# Patient Record
Sex: Male | Born: 2000 | Hispanic: No | Marital: Single | Smoking: Never smoker
Health system: Southern US, Community
[De-identification: ages and names within clinical notes are randomized; demographics above are authoritative.]

---
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
## 2019-05-02 ENCOUNTER — Encounter (HOSPITAL_COMMUNITY): Payer: Self-pay

## 2019-05-02 ENCOUNTER — Other Ambulatory Visit: Payer: Self-pay

## 2019-05-02 ENCOUNTER — Ambulatory Visit (HOSPITAL_COMMUNITY)
Admission: EM | Admit: 2019-05-02 | Discharge: 2019-05-02 | Disposition: A | Discharge status: Home / Self Care | Payer: Managed Care, Other (non HMO) | Attending: Family Medicine | Admitting: Family Medicine

## 2019-05-02 DIAGNOSIS — R05 Cough: Secondary | ICD-10-CM | POA: Diagnosis not present

## 2019-05-02 DIAGNOSIS — B9789 Other viral agents as the cause of diseases classified elsewhere: Secondary | ICD-10-CM

## 2019-05-02 DIAGNOSIS — Z20828 Contact with and (suspected) exposure to other viral communicable diseases: Secondary | ICD-10-CM | POA: Insufficient documentation

## 2019-05-02 DIAGNOSIS — J069 Acute upper respiratory infection, unspecified: Secondary | ICD-10-CM | POA: Insufficient documentation

## 2019-05-02 MED ORDER — FLUTICASONE PROPIONATE 50 MCG/ACT NA SUSP: 1.0000 | Freq: Every day | NASAL | 0 refills | Status: DC

## 2019-05-02 MED ORDER — CETIRIZINE HCL 10 MG PO CAPS: 10.0000 mg | ORAL_CAPSULE | Freq: Every day | ORAL | 0 refills | Status: DC

## 2019-05-02 NOTE — ED Triage Notes (Signed)
Pt present nasal congestion and sore throat. Symptoms started on Friday. Pt states his throat is not hurting any more after he took some OTC medication but he is still having the nasal congestion.

## 2019-05-02 NOTE — Discharge Instructions (Addendum)
COVID pending, monitor MyChart for results, quarantine until results return - if positive must quarantine 10 days from symptoms onset, symptoms improving and fever free for 24 hours Daily cetirizine and flonase for congestion  Follow up if not improving or worsening

## 2019-05-04 LAB — NOVEL CORONAVIRUS, NAA (HOSP ORDER, SEND-OUT TO REF LAB; TAT 18-24 HRS): SARS-CoV-2, NAA: NOT DETECTED

## 2019-05-04 NOTE — ED Provider Notes (Signed)
[facility]    CSN: [id number] Arrival date & time: 05/02/19  1424      History   Chief Complaint Chief Complaint  Patient presents with  . Nasal Congestion    HPI Louis Day is a 18 y.o. male no significant past medical history presenting today for evaluation of URI symptoms.  Patient states that he has had congestion and sore throat which began on Friday, persisted for the past 3 days.  His throat throat has improved, but continues to have nasal congestion.  He denies any coughing.  Denies any fevers chills or body aches.  Denies any known exposure to Covid.  Denies chest pain or shortness of breath.   HPI  History reviewed. No pertinent past medical history.  There are no problems to display for this patient.   History reviewed. No pertinent surgical history.     Home Medications    Prior to Admission medications   Medication Sig Start Date End Date Taking? Authorizing Provider  Cetirizine HCl 10 MG CAPS Take 1 capsule (10 mg total) by mouth daily. 05/02/19   Louetta Hollingshead C, PA-C  fluticasone (FLONASE) 50 MCG/ACT nasal spray Place 1-2 sprays into both nostrils daily. 05/02/19   Yalexa Blust C, PA-C  ibuprofen (ADVIL,MOTRIN) 800 MG tablet Take 1 tablet (800 mg total) by mouth 3 (three) times daily. 01/15/17   Muthersbaugh, Dahlia Client, PA-C  oseltamivir (TAMIFLU) 75 MG capsule Take 1 capsule (75 mg total) by mouth every 12 (twelve) hours. 07/18/15   Marlon Pel, PA-C    Family History History reviewed. No pertinent family history.  Social History Social History   Tobacco Use  . Smoking status: Never Smoker  Substance Use Topics  . Alcohol use: No  . Drug use: No     Allergies   Patient has no known allergies.   Review of Systems Review of Systems  Constitutional: Negative for activity change, appetite change, chills, fatigue and fever.  HENT: Positive for congestion and sore throat. Negative for ear pain, rhinorrhea, sinus pressure  and trouble swallowing.   Eyes: Negative for discharge and redness.  Respiratory: Negative for cough, chest tightness and shortness of breath.   Cardiovascular: Negative for chest pain.  Gastrointestinal: Negative for abdominal pain, diarrhea, nausea and vomiting.  Musculoskeletal: Negative for myalgias.  Skin: Negative for rash.  Neurological: Negative for dizziness, light-headedness and headaches.     Physical Exam Triage Vital Signs ED Triage Vitals  Enc Vitals Group     BP 05/02/19 1607 113/70     Pulse Rate 05/02/19 1607 82     Resp 05/02/19 1607 18     Temp 05/02/19 1607 98.5 F (36.9 C)     Temp Source 05/02/19 1607 Oral     SpO2 05/02/19 1607 100 %     Weight --      Height --      Head Circumference --      Peak Flow --      Pain Score 05/02/19 1609 0     Pain Loc --      Pain Edu? --      Excl. in GC? --    No data found.  Updated Vital Signs BP 113/70 (BP Location: Left Arm)   Pulse 82   Temp 98.5 F (36.9 C) (Oral)   Resp 18   SpO2 100%   Visual Acuity Right Eye Distance:   Left Eye Distance:   Bilateral Distance:    Right Eye Near:  Left Eye Near:    Bilateral Near:     Physical Exam Vitals and nursing note reviewed.  Constitutional:      Appearance: He is well-developed.  HENT:     Head: Normocephalic and atraumatic.     Ears:     Comments: Bilateral ears without tenderness to palpation of external auricle, tragus and mastoid, EAC's without erythema or swelling, TM's with good bony landmarks and cone of light. Non erythematous.     Mouth/Throat:     Comments: Oral mucosa pink and moist, no tonsillar enlargement or exudate. Posterior pharynx patent and nonerythematous, no uvula deviation or swelling. Normal phonation. Eyes:     Conjunctiva/sclera: Conjunctivae normal.  Cardiovascular:     Rate and Rhythm: Normal rate and regular rhythm.     Heart sounds: No murmur.  Pulmonary:     Effort: Pulmonary effort is normal. No respiratory  distress.     Breath sounds: Normal breath sounds.     Comments: Breathing comfortably at rest, CTABL, no wheezing, rales or other adventitious sounds auscultated Abdominal:     Palpations: Abdomen is soft.     Tenderness: There is no abdominal tenderness.  Musculoskeletal:     Cervical back: Neck supple.  Skin:    General: Skin is warm and dry.  Neurological:     Mental Status: He is alert.      UC Treatments / Results  Labs (all labs ordered are listed, but only abnormal results are displayed) Labs Reviewed  NOVEL CORONAVIRUS, NAA (HOSP ORDER, SEND-OUT TO REF LAB; TAT 18-24 HRS)    EKG   Radiology No results found.  Procedures Procedures (including critical care time)  Medications Ordered in UC Medications - No data to display  Initial Impression / Assessment and Plan / UC Course  I have reviewed the triage vital signs and the nursing notes.  Pertinent labs & imaging results that were available during my care of the patient were reviewed by me and considered in my medical decision making (see chart for details).    URI symptoms x3 to 4 days, Covid swab pending, will have quarantine until results return.  Will treat symptomatically and supportively at this time, vital signs stable, no acute distress, exam unremarkable.  Cetirizine and Flonase for congestion/sneezing.  Continue to monitor,Discussed strict return precautions. Patient verbalized understanding and is agreeable with plan.  Final Clinical Impressions(s) / UC Diagnoses   Final diagnoses:  Viral URI with cough     Discharge Instructions     COVID pending, monitor MyChart for results, quarantine until results return - if positive must quarantine 10 days from symptoms onset, symptoms improving and fever free for 24 hours Daily cetirizine and flonase for congestion  Follow up if not improving or worsening   ED Prescriptions    Medication Sig Dispense Auth. Provider   fluticasone (FLONASE) 50 MCG/ACT  nasal spray Place 1-2 sprays into both nostrils daily. 1 g Zackeriah Kissler C, PA-C   Cetirizine HCl 10 MG CAPS Take 1 capsule (10 mg total) by mouth daily. 15 capsule Marae Cottrell, [place] C, PA-C     PDMP not reviewed this encounter.   Janith Lima, [place] 05/04/19 [phone]

## 2019-06-06 ENCOUNTER — Ambulatory Visit (HOSPITAL_COMMUNITY)
Admission: EM | Admit: 2019-06-06 | Discharge: 2019-06-06 | Disposition: A | Discharge status: Home / Self Care | Payer: Managed Care, Other (non HMO) | Attending: Internal Medicine | Admitting: Internal Medicine

## 2019-06-06 ENCOUNTER — Encounter (HOSPITAL_COMMUNITY): Payer: Self-pay

## 2019-06-06 DIAGNOSIS — B349 Viral infection, unspecified: Secondary | ICD-10-CM | POA: Diagnosis not present

## 2019-06-06 DIAGNOSIS — Z7952 Long term (current) use of systemic steroids: Secondary | ICD-10-CM | POA: Insufficient documentation

## 2019-06-06 DIAGNOSIS — R21 Rash and other nonspecific skin eruption: Secondary | ICD-10-CM | POA: Insufficient documentation

## 2019-06-06 DIAGNOSIS — Z79899 Other long term (current) drug therapy: Secondary | ICD-10-CM | POA: Insufficient documentation

## 2019-06-06 DIAGNOSIS — Z20822 Contact with and (suspected) exposure to covid-19: Secondary | ICD-10-CM | POA: Insufficient documentation

## 2019-06-06 LAB — POC SARS CORONAVIRUS 2 AG -  ED: SARS Coronavirus 2 Ag: NEGATIVE

## 2019-06-06 LAB — POC SARS CORONAVIRUS 2 AG: SARS Coronavirus 2 Ag: NEGATIVE

## 2019-06-06 MED ORDER — ONDANSETRON 4 MG PO TBDP: 4.0000 mg | ORAL_TABLET | Freq: Three times a day (TID) | ORAL | 0 refills | Status: DC | PRN

## 2019-06-06 MED ORDER — PREDNISONE 10 MG (21) PO TBPK: ORAL_TABLET | ORAL | 0 refills | Status: DC

## 2019-06-06 MED ORDER — DIPHENHYDRAMINE HCL 25 MG PO TABS: 25.0000 mg | ORAL_TABLET | Freq: Four times a day (QID) | ORAL | 0 refills | Status: DC | PRN

## 2019-06-06 MED ORDER — PERMETHRIN 5 % EX CREA: TOPICAL_CREAM | CUTANEOUS | 0 refills | Status: DC

## 2019-06-06 NOTE — ED Triage Notes (Signed)
Pt presents to UC with headaches, body aches, nausea and vomiting x 3 days. Pt reports he woke up this morning with red spots in his back and hands.

## 2019-06-06 NOTE — Discharge Instructions (Addendum)
Take the prednisone daily with food for the next 6 days. Covid swab pending Rapid Covid test negative here. Zofran as needed for nausea, vomiting.  Make sure you are staying hydrated and drinking fluids to include Gatorade and water Advance diet as tolerated Tylenol as needed for headache.  Follow up as needed for continued or worsening symptoms

## 2019-06-07 NOTE — ED Provider Notes (Signed)
[facility]    CSN: [id number] Arrival date & time: 06/06/19  1153      History   Chief Complaint Chief Complaint  Patient presents with  . Headache  . Emesis  . Generalized Body Aches    HPI Louis Day is a 19 y.o. male.   Pt is a 19 year old male that presents with headache, body aches, nausea, and vomiting x 3. This has been present x 3 days. Symptoms have been constant, waxing and waning. He has also developed a rash. The rash is widespread. Very itchy. He has been using hydrocortisone cream with some relief. Unsure if he has had a fever but has had hot flashes. No known sick contacts. No recent travels.  Denies any  joint pain. Denies any recent changes in lotions, detergents, foods or other possible irritants. No recent travel. Nobody else at home has the rash. Patient has been outside but denies any contact with plants or insects. No new foods or medications.   ROS per HPI        History reviewed. No pertinent past medical history.  There are no problems to display for this patient.   History reviewed. No pertinent surgical history.     Home Medications    Prior to Admission medications   Medication Sig Start Date End Date Taking? Authorizing Provider  diphenhydrAMINE (BENADRYL) 25 MG tablet Take 1 tablet (25 mg total) by mouth every 6 (six) hours as needed. 06/06/19   Loura Halt A, NP  ondansetron (ZOFRAN ODT) 4 MG disintegrating tablet Take 1 tablet (4 mg total) by mouth every 8 (eight) hours as needed for nausea or vomiting. 06/06/19   Loura Halt A, NP  predniSONE (STERAPRED UNI-PAK 21 TAB) 10 MG (21) TBPK tablet 6 tabs for 1 day, then 5 tabs for 1 das, then 4 tabs for 1 day, then 3 tabs for 1 day, 2 tabs for 1 day, then 1 tab for 1 day 06/06/19   Loura Halt A, NP  Cetirizine HCl 10 MG CAPS Take 1 capsule (10 mg total) by mouth daily. 05/02/19 06/06/19  Wieters, Hallie C, PA-C  fluticasone (FLONASE) 50 MCG/ACT nasal spray Place 1-2 sprays  into both nostrils daily. 05/02/19 06/06/19  Wieters, Elesa Hacker, PA-C    Family History Family History  Problem Relation Age of Onset  . Healthy Mother   . Healthy Father     Social History Social History   Tobacco Use  . Smoking status: Never Smoker  . Smokeless tobacco: Never Used  Substance Use Topics  . Alcohol use: No  . Drug use: No     Allergies   Patient has no known allergies.   Review of Systems Review of Systems   Physical Exam Triage Vital Signs ED Triage Vitals  Enc Vitals Group     BP 06/06/19 1325 126/80     Pulse Rate 06/06/19 1325 (!) 119     Resp 06/06/19 1325 18     Temp 06/06/19 1325 98.5 F (36.9 C)     Temp Source 06/06/19 1325 Oral     SpO2 06/06/19 1325 100 %     Weight --      Height --      Head Circumference --      Peak Flow --      Pain Score 06/06/19 1323 8     Pain Loc --      Pain Edu? --      Excl. in  GC? --    No data found.  Updated Vital Signs BP 126/80 (BP Location: Left Arm)   Pulse (!) 104   Temp 98.5 F (36.9 C) (Oral)   Resp 18   SpO2 100%   Visual Acuity Right Eye Distance:   Left Eye Distance:   Bilateral Distance:    Right Eye Near:   Left Eye Near:    Bilateral Near:     Physical Exam Vitals and nursing note reviewed.  Constitutional:      General: He is not in acute distress.    Appearance: Normal appearance. He is well-developed. He is not ill-appearing, toxic-appearing or diaphoretic.  HENT:     Head: Normocephalic and atraumatic.     Nose: Nose normal.     Mouth/Throat:     Pharynx: Oropharynx is clear.     Tonsils: 1+ on the right. 1+ on the left.  Cardiovascular:     Rate and Rhythm: Normal rate and regular rhythm.  Pulmonary:     Effort: Pulmonary effort is normal.     Breath sounds: Normal breath sounds.  Musculoskeletal:     Cervical back: Normal range of motion. No rigidity.  Skin:    Findings: Rash present.     Comments: Widespread urticaria   Neurological:     Mental  Status: He is alert.  Psychiatric:        Mood and Affect: Mood normal.      UC Treatments / Results  Labs (all labs ordered are listed, but only abnormal results are displayed) Labs Reviewed  NOVEL CORONAVIRUS, NAA (HOSP ORDER, SEND-OUT TO REF LAB; TAT 18-24 HRS)  POC SARS CORONAVIRUS 2 AG -  ED  POC SARS CORONAVIRUS 2 AG    EKG   Radiology No results found.  Procedures Procedures (including critical care time)  Medications Ordered in UC Medications - No data to display  Initial Impression / Assessment and Plan / UC Course  I have reviewed the triage vital signs and the nursing notes.  Pertinent labs & imaging results that were available during my care of the patient were reviewed by me and considered in my medical decision making (see chart for details).     Viral illness- COVID swab sent for testing.  Rapid COVID test negative here.  Quarantine precautions given.  Rash could be viral vs allergic. Treating with prednisone and benadryl.  Zofran for nausea and tylenol for fever and pain.  Final Clinical Impressions(s) / UC Diagnoses   Final diagnoses:  Viral illness  Rash and nonspecific skin eruption     Discharge Instructions     Take the prednisone daily with food for the next 6 days. Covid swab pending Rapid Covid test negative here. Zofran as needed for nausea, vomiting.  Make sure you are staying hydrated and drinking fluids to include Gatorade and water Advance diet as tolerated Tylenol as needed for headache.  Follow up as needed for continued or worsening symptoms     ED Prescriptions    Medication Sig Dispense Auth. Provider   predniSONE (STERAPRED UNI-PAK 21 TAB) 10 MG (21) TBPK tablet 6 tabs for 1 day, then 5 tabs for 1 das, then 4 tabs for 1 day, then 3 tabs for 1 day, 2 tabs for 1 day, then 1 tab for 1 day 21 tablet Deztiny Sarra A, NP   diphenhydrAMINE (BENADRYL) 25 MG tablet Take 1 tablet (25 mg total) by mouth every 6 (six) hours as  needed. 30 tablet [place],  Jonathen Rathman A, NP   permethrin (ELIMITE) 5 % cream  (Status: Discontinued) Apply to affected area once 60 g Cavion Faiola A, NP   ondansetron (ZOFRAN ODT) 4 MG disintegrating tablet Take 1 tablet (4 mg total) by mouth every 8 (eight) hours as needed for nausea or vomiting. 20 tablet Dahlia Byes A, NP     PDMP not reviewed this encounter.   Janace Aris, NP 06/07/19 [phone]

## 2019-06-08 LAB — NOVEL CORONAVIRUS, NAA (HOSP ORDER, SEND-OUT TO REF LAB; TAT 18-24 HRS): SARS-CoV-2, NAA: NOT DETECTED

## 2020-02-04 ENCOUNTER — Other Ambulatory Visit: Payer: Self-pay

## 2020-02-04 ENCOUNTER — Encounter (HOSPITAL_COMMUNITY): Payer: Self-pay | Admitting: Urgent Care

## 2020-02-04 ENCOUNTER — Ambulatory Visit (HOSPITAL_COMMUNITY)
Admission: EM | Admit: 2020-02-04 | Discharge: 2020-02-04 | Disposition: A | Discharge status: Home / Self Care | Payer: Managed Care, Other (non HMO) | Attending: Urgent Care | Admitting: Urgent Care

## 2020-02-04 DIAGNOSIS — J3489 Other specified disorders of nose and nasal sinuses: Secondary | ICD-10-CM

## 2020-02-04 DIAGNOSIS — R059 Cough, unspecified: Secondary | ICD-10-CM

## 2020-02-04 DIAGNOSIS — M549 Dorsalgia, unspecified: Secondary | ICD-10-CM

## 2020-02-04 DIAGNOSIS — B349 Viral infection, unspecified: Secondary | ICD-10-CM

## 2020-02-04 DIAGNOSIS — R079 Chest pain, unspecified: Secondary | ICD-10-CM

## 2020-02-04 MED ORDER — PROMETHAZINE-DM 6.25-15 MG/5ML PO SYRP: 5.0000 mL | ORAL_SOLUTION | Freq: Every evening | ORAL | 0 refills | Status: DC | PRN

## 2020-02-04 MED ORDER — BENZONATATE 100 MG PO CAPS: 100.0000 mg | ORAL_CAPSULE | Freq: Three times a day (TID) | ORAL | 0 refills | Status: DC | PRN

## 2020-02-04 MED ORDER — CETIRIZINE HCL 10 MG PO TABS: 10.0000 mg | ORAL_TABLET | Freq: Every day | ORAL | 0 refills | Status: DC

## 2020-02-04 NOTE — Discharge Instructions (Signed)

## 2020-02-04 NOTE — ED Provider Notes (Signed)
[facility]   MRN: [id number] DOB: Aug 08, 2000  Subjective:   Louis Day is a 19 y.o. male presenting for 4-day history of acute onset malaise.  Has had runny and stuffy nose, sore throat, cough, left-sided chest pain.  Has also had more back pain recently.  He is not Covid vaccinated.  States that he works 6 days a week, does 12-hour shifts.  Perform strenuous work activities.  Denies history of respiratory disorder, lung disorder, asthma, smoking.  No alcohol use.  Denies taking chronic medications.  Patient is otherwise in good health condition.  No Known Allergies  History reviewed. No pertinent past medical history.   History reviewed. No pertinent surgical history.  Family History  Problem Relation Age of Onset  . Healthy Mother   . Healthy Father     Social History   Tobacco Use  . Smoking status: Never Smoker  . Smokeless tobacco: Never Used  Substance Use Topics  . Alcohol use: No  . Drug use: No    ROS   Objective:   Vitals: BP 116/70 (BP Location: Right Arm)   Pulse (!) 110   Temp 99.4 F (37.4 C) (Oral)   Resp 18   SpO2 100%   Physical Exam Constitutional:      General: He is not in acute distress.    Appearance: Normal appearance. He is well-developed. He is not ill-appearing, toxic-appearing or diaphoretic.  HENT:     Head: Normocephalic and atraumatic.     Right Ear: External ear normal.     Left Ear: External ear normal.     Nose: Nose normal.     Mouth/Throat:     Mouth: Mucous membranes are moist.     Pharynx: Oropharynx is clear. No oropharyngeal exudate or posterior oropharyngeal erythema.  Eyes:     General: No scleral icterus.       Right eye: No discharge.        Left eye: No discharge.     Extraocular Movements: Extraocular movements intact.     Conjunctiva/sclera: Conjunctivae normal.     Pupils: Pupils are equal, round, and reactive to light.  Cardiovascular:     Rate and Rhythm: Normal rate and regular  rhythm.     Heart sounds: Normal heart sounds. No murmur heard.  No friction rub. No gallop.   Pulmonary:     Effort: Pulmonary effort is normal. No respiratory distress.     Breath sounds: Normal breath sounds. No stridor. No wheezing, rhonchi or rales.  Chest:     Chest wall: No tenderness.  Skin:    General: Skin is warm and dry.  Neurological:     Mental Status: He is alert and oriented to person, place, and time.     Motor: No weakness.     Coordination: Coordination normal.     Gait: Gait normal.  Psychiatric:        Mood and Affect: Mood normal.        Behavior: Behavior normal.        Thought Content: Thought content normal.        Judgment: Judgment normal.      Assessment and Plan :   PDMP not reviewed this encounter.  1. Viral syndrome   2. Stuffy and runny nose   3. Cough   4. Acute back pain, unspecified back location, unspecified back pain laterality   5. Left-sided chest pain     Will manage for viral illness  such as viral URI, viral syndrome, viral rhinitis, COVID-19. Counseled patient on nature of COVID-19 including modes of transmission, diagnostic testing, management and supportive care.  Offered scripts for symptomatic relief. COVID 19 testing is pending.  Discussed importance of self-care, limiting his work hours to less than 60/week.  Counseled patient on potential for adverse effects with medications prescribed/recommended today, ER and return-to-clinic precautions discussed, patient verbalized understanding.     Wallis Bamberg, PA-C 02/04/20 1428

## 2020-02-04 NOTE — ED Triage Notes (Signed)
Pt presents with left sided middle back pain x 4 days. Pt states the back pain can be related to his work, as he works 6 days a week 12 hrs shift. Pt states he feel he is skinny and not eating well. Aleve gives no relief.

## 2020-09-28 IMAGING — DX DG CHEST 1V
1 series · 1 of 1 positions shown · non-contrast
Comparison: Two-view chest x-ray 12/11/2016

CLINICAL DATA: Physical exam.

EXAM:
CHEST  1 VIEW

[chest pa]
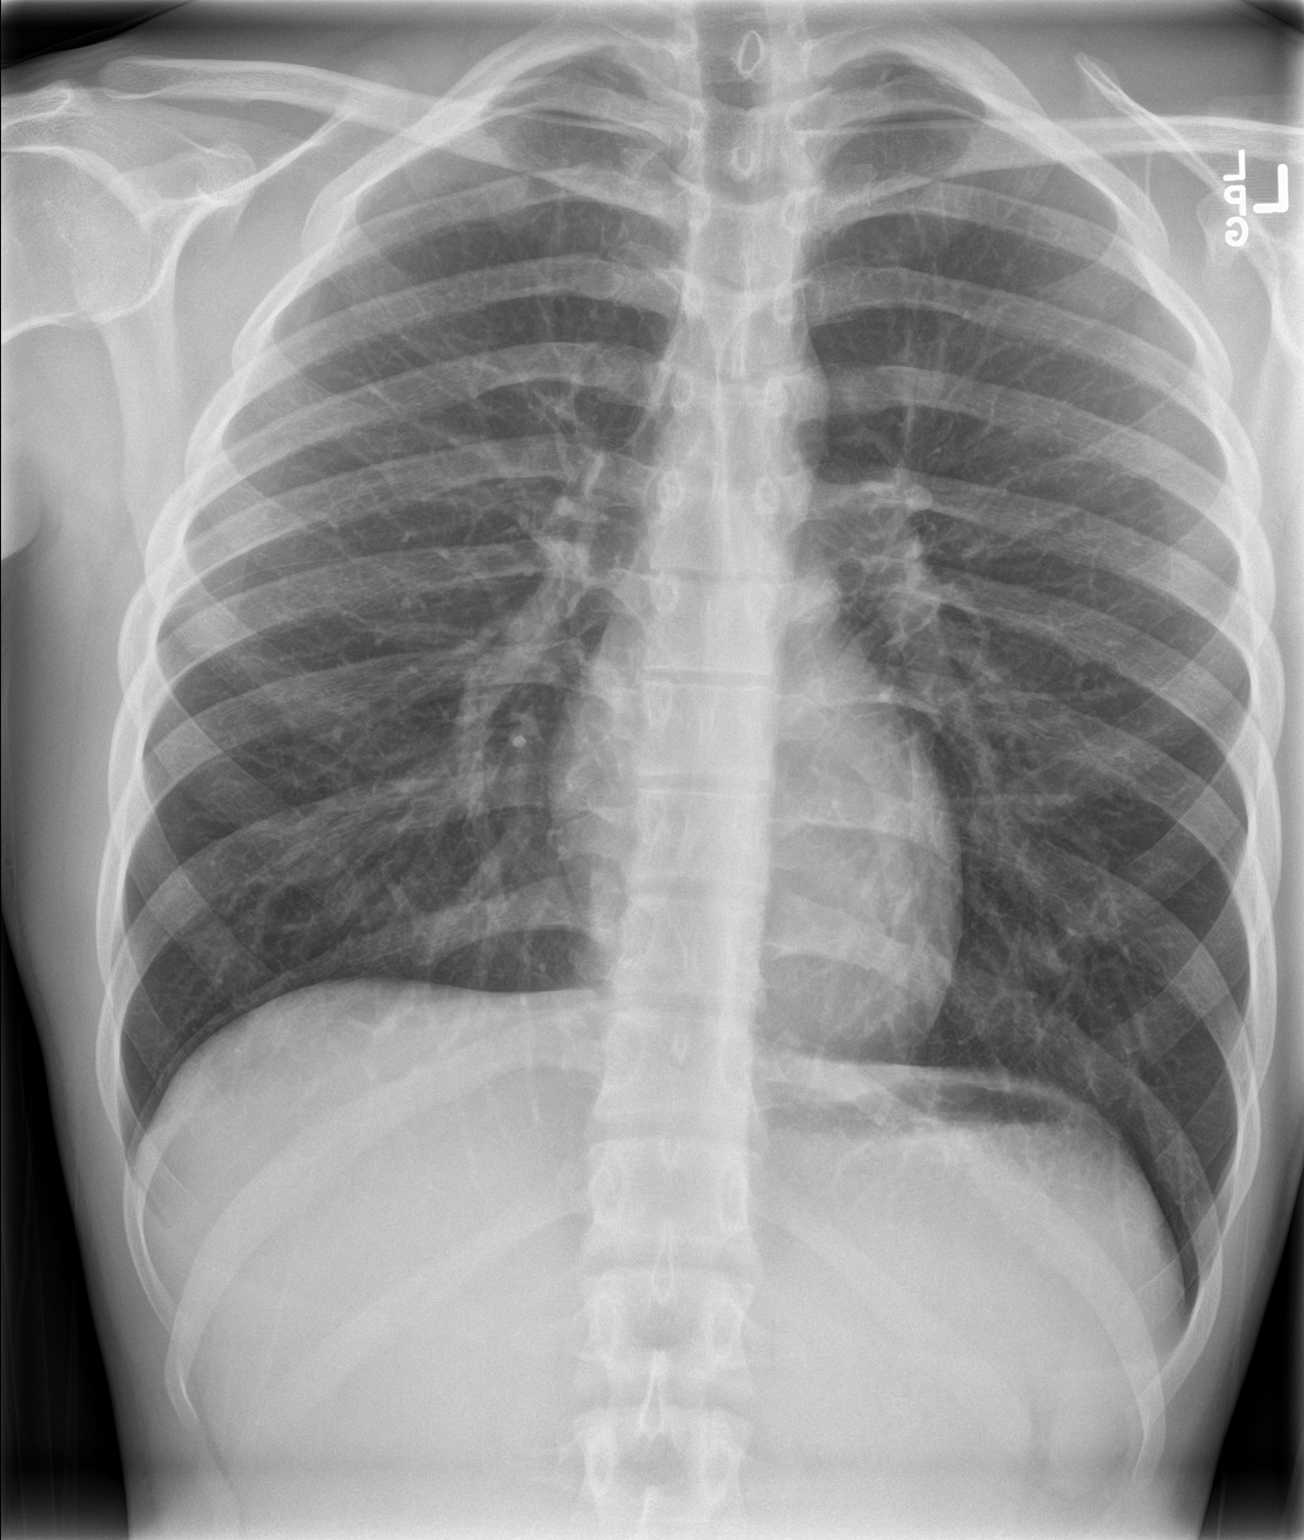

[1 of 1 positions shown; findings below may reference images not displayed]

FINDINGS: The heart size and mediastinal contours are within normal limits.
Both lungs are clear. The visualized skeletal structures are
unremarkable.
IMPRESSION: Negative two view chest x-ray

## 2021-05-17 ENCOUNTER — Other Ambulatory Visit: Payer: Self-pay

## 2021-05-17 ENCOUNTER — Encounter (HOSPITAL_COMMUNITY): Payer: Self-pay | Admitting: Emergency Medicine

## 2021-05-17 ENCOUNTER — Ambulatory Visit (HOSPITAL_COMMUNITY)
Admission: EM | Admit: 2021-05-17 | Discharge: 2021-05-17 | Disposition: A | Discharge status: Home / Self Care | Payer: Managed Care, Other (non HMO) | Attending: Family Medicine | Admitting: Family Medicine

## 2021-05-17 DIAGNOSIS — J029 Acute pharyngitis, unspecified: Secondary | ICD-10-CM | POA: Diagnosis not present

## 2021-05-17 LAB — POCT RAPID STREP A, ED / UC: Streptococcus, Group A Screen (Direct): NEGATIVE

## 2021-05-17 NOTE — ED Triage Notes (Signed)
C/o sore throat since yesterday

## 2021-05-17 NOTE — Discharge Instructions (Addendum)
Your strep test is negative.  This is most likely viral in origin, and will need some time to get better. Throat culture was sent today to make sure it's not strep; our staff will call you if the culture is positive, and we will get antibiotics sent in.  Use tylenol or ibuprofen as needed for pain. Also cepacol can help.

## 2021-05-17 NOTE — ED Provider Notes (Signed)
[facility]    CSN: [id number] Arrival date & time: 05/17/21  1236      History   Chief Complaint Chief Complaint  Patient presents with   Sore Throat    HPI Louis Day is a 21 y.o. male.    Sore Throat  Here for a h/o sore throat since yesterday. No f/c. Has not begun having rhinorrhea/congestion or cough. No v/d. No known exposures.  No h/a  History reviewed. No pertinent past medical history.  There are no problems to display for this patient.   History reviewed. No pertinent surgical history.     Home Medications    Prior to Admission medications   Medication Sig Start Date End Date Taking? Authorizing Provider  diphenhydrAMINE (BENADRYL) 25 MG tablet Take 1 tablet (25 mg total) by mouth every 6 (six) hours as needed. 06/06/19 02/04/20  Loura Halt A, NP  fluticasone (FLONASE) 50 MCG/ACT nasal spray Place 1-2 sprays into both nostrils daily. 05/02/19 06/06/19  Wieters, Elesa Hacker, PA-C    Family History Family History  Problem Relation Age of Onset   Healthy Mother    Healthy Father     Social History Social History   Tobacco Use   Smoking status: Never   Smokeless tobacco: Never  Substance Use Topics   Alcohol use: No   Drug use: No     Allergies   Patient has no known allergies.   Review of Systems Review of Systems   Physical Exam Triage Vital Signs ED Triage Vitals  Enc Vitals Group     BP 05/17/21 1344 114/73     Pulse Rate 05/17/21 1344 77     Resp 05/17/21 1344 15     Temp 05/17/21 1344 98.6 F (37 C)     Temp Source 05/17/21 1344 Oral     SpO2 05/17/21 1344 97 %     Weight --      Height --      Head Circumference --      Peak Flow --      Pain Score 05/17/21 1343 4     Pain Loc --      Pain Edu? --      Excl. in [place]? --    No data found.  Updated Vital Signs BP 114/73 (BP Location: Left Arm)    Pulse 77    Temp 98.6 F (37 C) (Oral)    Resp 15    SpO2 97%   Visual Acuity Right Eye Distance:    Left Eye Distance:   Bilateral Distance:    Right Eye Near:   Left Eye Near:    Bilateral Near:     Physical Exam Vitals reviewed.  Constitutional:      General: He is not in acute distress.    Appearance: He is not toxic-appearing.  HENT:     Right Ear: Tympanic membrane and ear canal normal.     Left Ear: Tympanic membrane and ear canal normal.     Nose: Nose normal.     Mouth/Throat:     Mouth: Mucous membranes are moist.     Pharynx: Posterior oropharyngeal erythema (mild erythema of posterior OP.) present. No oropharyngeal exudate.  Eyes:     Extraocular Movements: Extraocular movements intact.     Conjunctiva/sclera: Conjunctivae normal.     Pupils: Pupils are equal, round, and reactive to light.  Cardiovascular:     Rate and Rhythm: Normal rate and regular rhythm.  Heart sounds: No murmur heard. Pulmonary:     Effort: Pulmonary effort is normal.     Breath sounds: Normal breath sounds.  Musculoskeletal:     Cervical back: Neck supple.  Lymphadenopathy:     Cervical: No cervical adenopathy.  Skin:    Capillary Refill: Capillary refill takes less than 2 seconds.     Coloration: Skin is not jaundiced or pale.  Neurological:     General: No focal deficit present.     Mental Status: He is alert and oriented to person, place, and time.  Psychiatric:        Behavior: Behavior normal.     UC Treatments / Results  Labs (all labs ordered are listed, but only abnormal results are displayed) Labs Reviewed  CULTURE, GROUP A STREP [facility])  POCT RAPID STREP A, ED / UC    EKG   Radiology No results found.  Procedures Procedures (including critical care time)  Medications Ordered in UC Medications - No data to display  Initial Impression / Assessment and Plan / UC Course  I have reviewed the triage vital signs and the nursing notes.  Pertinent labs & imaging results that were available during my care of the patient were reviewed by me and considered in my  medical decision making (see chart for details).     Rapid strep is negative.  Final Clinical Impressions(s) / UC Diagnoses   Final diagnoses:  Acute pharyngitis, unspecified etiology     Discharge Instructions      Your strep test is negative.  This is most likely viral in origin, and will need some time to get better. Throat culture was sent today to make sure it's not strep; our staff will call you if the culture is positive, and we will get antibiotics sent in.  Use tylenol or ibuprofen as needed for pain. Also cepacol can help.     ED Prescriptions   None    PDMP not reviewed this encounter.   Barrett Henle, MD 05/17/21 [phone]

## 2021-05-20 LAB — CULTURE, GROUP A STREP (THRC)

## 2021-06-05 ENCOUNTER — Ambulatory Visit (HOSPITAL_COMMUNITY)
Admission: EM | Admit: 2021-06-05 | Discharge: 2021-06-05 | Disposition: A | Discharge status: Home / Self Care | Payer: Managed Care, Other (non HMO) | Attending: Emergency Medicine | Admitting: Emergency Medicine

## 2021-06-05 ENCOUNTER — Other Ambulatory Visit: Payer: Self-pay

## 2021-06-05 ENCOUNTER — Encounter (HOSPITAL_COMMUNITY): Payer: Self-pay | Admitting: Emergency Medicine

## 2021-06-05 DIAGNOSIS — Z20822 Contact with and (suspected) exposure to covid-19: Secondary | ICD-10-CM | POA: Insufficient documentation

## 2021-06-05 DIAGNOSIS — J069 Acute upper respiratory infection, unspecified: Secondary | ICD-10-CM | POA: Insufficient documentation

## 2021-06-05 DIAGNOSIS — J029 Acute pharyngitis, unspecified: Secondary | ICD-10-CM | POA: Insufficient documentation

## 2021-06-05 LAB — POC INFLUENZA A AND B ANTIGEN (URGENT CARE ONLY)
INFLUENZA A ANTIGEN, POC: NEGATIVE
INFLUENZA B ANTIGEN, POC: NEGATIVE

## 2021-06-05 NOTE — ED Provider Notes (Signed)
[facility]    CSN: [id number] Arrival date & time: 06/05/21  4627      History   Chief Complaint Chief Complaint  Patient presents with   Sore Throat   Nasal Congestion    HPI Louis Day is a 21 y.o. male. He developed a sore throat 06/02/21 and then later developed a fever, nasal congestion, and cough. Denies N/V/D or myalgias. Has not tested self for covid at home. Did not get flu shot, has not had flu this year. Has been using robitussin and tylenol for symptoms, has not taken today.    Sore Throat Associated symptoms include headaches. Pertinent negatives include no shortness of breath.   History reviewed. No pertinent past medical history.  There are no problems to display for this patient.   History reviewed. No pertinent surgical history.     Home Medications    Prior to Admission medications   Medication Sig Start Date End Date Taking? Authorizing Provider  diphenhydrAMINE (BENADRYL) 25 MG tablet Take 1 tablet (25 mg total) by mouth every 6 (six) hours as needed. 06/06/19 02/04/20  Dahlia Byes A, NP  fluticasone (FLONASE) 50 MCG/ACT nasal spray Place 1-2 sprays into both nostrils daily. 05/02/19 06/06/19  Wieters, Junius Creamer, PA-C    Family History Family History  Problem Relation Age of Onset   Healthy Mother    Healthy Father     Social History Social History   Tobacco Use   Smoking status: Never   Smokeless tobacco: Never  Substance Use Topics   Alcohol use: No   Drug use: No     Allergies   Patient has no known allergies.   Review of Systems Review of Systems  Constitutional:  Positive for chills and fever.  HENT:  Positive for congestion, postnasal drip and sore throat.   Respiratory:  Positive for cough. Negative for shortness of breath and wheezing.   Gastrointestinal:  Negative for diarrhea, nausea and vomiting.  Musculoskeletal:  Negative for myalgias.  Neurological:  Positive for headaches.    Physical  Exam Triage Vital Signs ED Triage Vitals  Enc Vitals Group     BP 06/05/21 0823 118/68     Pulse Rate 06/05/21 0823 88     Resp 06/05/21 0823 16     Temp 06/05/21 0823 98.7 F (37.1 C)     Temp Source 06/05/21 0823 Oral     SpO2 06/05/21 0823 98 %     Weight --      Height --      Head Circumference --      Peak Flow --      Pain Score 06/05/21 0821 8     Pain Loc --      Pain Edu? --      Excl. in GC? --    No data found.  Updated Vital Signs BP 118/68 (BP Location: Left Arm)    Pulse 88    Temp 98.7 F (37.1 C) (Oral)    Resp 16    SpO2 98%   Visual Acuity Right Eye Distance:   Left Eye Distance:   Bilateral Distance:    Right Eye Near:   Left Eye Near:    Bilateral Near:     Physical Exam Constitutional:      Appearance: He is well-developed. He is ill-appearing.  HENT:     Right Ear: Tympanic membrane, ear canal and external ear normal.     Left Ear: Tympanic membrane, ear canal  and external ear normal.     Nose: Congestion present.     Mouth/Throat:     Mouth: Mucous membranes are moist.     Pharynx: Oropharynx is clear. No oropharyngeal exudate.     Comments: I can see post nasal drainage in posterior pharynx Cardiovascular:     Rate and Rhythm: Normal rate and regular rhythm.  Pulmonary:     Effort: Pulmonary effort is normal.     Breath sounds: Normal breath sounds.  Neurological:     Mental Status: He is alert.     UC Treatments / Results  Labs (all labs ordered are listed, but only abnormal results are displayed) Labs Reviewed  SARS CORONAVIRUS 2 (TAT 6-24 HRS)  POC INFLUENZA A AND B ANTIGEN (URGENT CARE ONLY)    EKG   Radiology No results found.  Procedures Procedures (including critical care time)  Medications Ordered in UC Medications - No data to display  Initial Impression / Assessment and Plan / UC Course  I have reviewed the triage vital signs and the nursing notes.  Pertinent labs & imaging results that were available  during my care of the patient were reviewed by me and considered in my medical decision making (see chart for details).    Flu test is negative, COVID is pending, reviewed supportive care measures.  Given note for work.  Final Clinical Impressions(s) / UC Diagnoses   Final diagnoses:  Viral upper respiratory tract infection     Discharge Instructions      You will get a call if tests are positive, you will not get a call if tests are negative but you can check results in MyChart if you have a MyChart account.   Try Mucinex (generic guaifenesin is ok to use) instead of robitussin.  Drink lots of liquids and stay hydrated.  For your sore throat, you might try throat lozenges or cough drops, gargling with salt water, and/or drinking hot herbal tea (such as chamomile) with honey and lemon juice in it.     ED Prescriptions   None    PDMP not reviewed this encounter.   Cathlyn Parsons, NP 06/05/21 [phone]

## 2021-06-05 NOTE — ED Triage Notes (Signed)
Pt repors sore throat, congestion, headaches since Sunday.

## 2021-06-05 NOTE — Discharge Instructions (Addendum)
You will get a call if tests are positive, you will not get a call if tests are negative but you can check results in MyChart if you have a MyChart account.   Try Mucinex (generic guaifenesin is ok to use) instead of robitussin.  Drink lots of liquids and stay hydrated.  For your sore throat, you might try throat lozenges or cough drops, gargling with salt water, and/or drinking hot herbal tea (such as chamomile) with honey and lemon juice in it.

## 2021-06-06 LAB — SARS CORONAVIRUS 2 (TAT 6-24 HRS): SARS Coronavirus 2: NEGATIVE

## 2021-06-10 ENCOUNTER — Ambulatory Visit (HOSPITAL_COMMUNITY)
Admission: EM | Admit: 2021-06-10 | Discharge: 2021-06-10 | Disposition: A | Discharge status: Home / Self Care | Payer: Managed Care, Other (non HMO) | Attending: Internal Medicine | Admitting: Internal Medicine

## 2021-06-10 ENCOUNTER — Other Ambulatory Visit: Payer: Self-pay

## 2021-06-10 ENCOUNTER — Encounter (HOSPITAL_COMMUNITY): Payer: Self-pay | Admitting: Emergency Medicine

## 2021-06-10 DIAGNOSIS — J069 Acute upper respiratory infection, unspecified: Secondary | ICD-10-CM | POA: Diagnosis not present

## 2021-06-10 MED ORDER — BENZONATATE 100 MG PO CAPS: 100.0000 mg | ORAL_CAPSULE | Freq: Three times a day (TID) | ORAL | 0 refills | Status: AC | PRN

## 2021-06-10 MED ORDER — ERYTHROMYCIN 5 MG/GM OP OINT: TOPICAL_OINTMENT | Freq: Every day | OPHTHALMIC | 0 refills | Status: AC

## 2021-06-10 NOTE — Discharge Instructions (Addendum)
Maintain adequate hydration Take medications as prescribed Your symptoms will improve over the coming days because your symptoms is most likely viral infection related. Your lung exam is clear Follow-up with primary care.

## 2021-06-10 NOTE — ED Triage Notes (Signed)
Pt reports that thought he was better enough to go to work. Reports when went to work today, he was having body aches, really fatigued, still has cough, had red and puffy eyes that itch. Denies eye drainage.

## 2021-06-11 NOTE — ED Provider Notes (Signed)
[facility]    CSN: [id number] Arrival date & time: 06/10/21  0805      History   Chief Complaint Chief Complaint  Patient presents with   Fatigue   Generalized Body Aches   Cough    HPI Louis Day is a 21 y.o. male comes to the urgent care with complaints of generalized body aches fatigue and a cough as well as itchy red puffy eyes.  Patient was seen in the urgent care 4 days ago for upper respiratory infection symptoms.  Supportive care was started.  Patient is tolerating oral intake.  He comes to urgent care today with complaints as stated above.  He denies any fever or chills.  He complains of itchy red eyes.  No eye discharge.  No fever or chills.  Patient has not returned to work yet. HPI  History reviewed. No pertinent past medical history.  There are no problems to display for this patient.   History reviewed. No pertinent surgical history.     Home Medications    Prior to Admission medications   Medication Sig Start Date End Date Taking? Authorizing Provider  benzonatate (TESSALON) 100 MG capsule Take 1 capsule (100 mg total) by mouth 3 (three) times daily as needed for cough. 06/10/21  Yes Kailene Steinhart, Britta Mccreedy, MD  erythromycin ophthalmic ointment Place into both eyes at bedtime for 5 days. Place a 1/2 inch ribbon of ointment into the lower eyelid. 06/10/21 06/15/21 Yes Risha Barretta, Britta Mccreedy, MD  diphenhydrAMINE (BENADRYL) 25 MG tablet Take 1 tablet (25 mg total) by mouth every 6 (six) hours as needed. 06/06/19 02/04/20  Dahlia Byes A, NP  fluticasone (FLONASE) 50 MCG/ACT nasal spray Place 1-2 sprays into both nostrils daily. 05/02/19 06/06/19  Wieters, Junius Creamer, PA-C    Family History Family History  Problem Relation Age of Onset   Healthy Mother    Healthy Father     Social History Social History   Tobacco Use   Smoking status: Never   Smokeless tobacco: Never  Substance Use Topics   Alcohol use: No   Drug use: No     Allergies   Patient  has no known allergies.   Review of Systems Review of Systems  Constitutional:  Positive for fatigue. Negative for chills and fever.  HENT: Negative.    Eyes:  Positive for redness and itching. Negative for pain and discharge.  Respiratory:  Positive for cough. Negative for shortness of breath.   Cardiovascular: Negative.     Physical Exam Triage Vital Signs ED Triage Vitals  Enc Vitals Group     BP 06/10/21 0829 116/76     Pulse Rate 06/10/21 0829 82     Resp 06/10/21 0829 15     Temp 06/10/21 0829 98.3 F (36.8 C)     Temp Source 06/10/21 0829 Oral     SpO2 06/10/21 0829 98 %     Weight --      Height --      Head Circumference --      Peak Flow --      Pain Score 06/10/21 0828 7     Pain Loc --      Pain Edu? --      Excl. in GC? --    No data found.  Updated Vital Signs BP 116/76 (BP Location: Right Arm)    Pulse 82    Temp 98.3 F (36.8 C) (Oral)    Resp 15    SpO2  98%   Visual Acuity Right Eye Distance:   Left Eye Distance:   Bilateral Distance:    Right Eye Near:   Left Eye Near:    Bilateral Near:     Physical Exam Vitals and nursing note reviewed.  Constitutional:      General: He is not in acute distress.    Appearance: He is not ill-appearing.  Eyes:     Extraocular Movements: Extraocular movements intact.     Pupils: Pupils are equal, round, and reactive to light.     Comments: Bilateral conjunctival erythema.  Cardiovascular:     Rate and Rhythm: Normal rate and regular rhythm.     Pulses: Normal pulses.     Heart sounds: Normal heart sounds.  Pulmonary:     Effort: Pulmonary effort is normal.     Breath sounds: Normal breath sounds.  Abdominal:     General: Bowel sounds are normal.     Palpations: Abdomen is soft.  Neurological:     Mental Status: He is alert.     UC Treatments / Results  Labs (all labs ordered are listed, but only abnormal results are displayed) Labs Reviewed - No data to display  EKG   Radiology No  results found.  Procedures Procedures (including critical care time)  Medications Ordered in UC Medications - No data to display  Initial Impression / Assessment and Plan / UC Course  I have reviewed the triage vital signs and the nursing notes.  Pertinent labs & imaging results that were available during my care of the patient were reviewed by me and considered in my medical decision making (see chart for details).     1.  Viral upper respiratory tract infection with cough:  Tessalon Perles as needed for cough Erythromycin eye ointment Maintain adequate hydration Return to urgent care if symptoms worsen. Final Clinical Impressions(s) / UC Diagnoses   Final diagnoses:  Viral upper respiratory tract infection with cough     Discharge Instructions      Maintain adequate hydration Take medications as prescribed Your symptoms will improve over the coming days because your symptoms is most likely viral infection related. Your lung exam is clear Follow-up with primary care.   ED Prescriptions     Medication Sig Dispense Auth. Provider   benzonatate (TESSALON) 100 MG capsule Take 1 capsule (100 mg total) by mouth 3 (three) times daily as needed for cough. 21 capsule Courteney Alderete, Britta Mccreedy, MD   erythromycin ophthalmic ointment Place into both eyes at bedtime for 5 days. Place a 1/2 inch ribbon of ointment into the lower eyelid. 3.5 g Nadalyn Deringer, Britta Mccreedy, MD      PDMP not reviewed this encounter.   Merrilee Jansky, MD 06/11/21 [id number]

## 2021-06-19 ENCOUNTER — Ambulatory Visit (HOSPITAL_COMMUNITY)
Admission: EM | Admit: 2021-06-19 | Discharge: 2021-06-19 | Disposition: A | Discharge status: Home / Self Care | Payer: Managed Care, Other (non HMO) | Attending: Physician Assistant | Admitting: Physician Assistant

## 2021-06-19 ENCOUNTER — Encounter (HOSPITAL_COMMUNITY): Payer: Self-pay | Admitting: Emergency Medicine

## 2021-06-19 ENCOUNTER — Other Ambulatory Visit: Payer: Self-pay

## 2021-06-19 DIAGNOSIS — J40 Bronchitis, not specified as acute or chronic: Secondary | ICD-10-CM | POA: Diagnosis not present

## 2021-06-19 MED ORDER — PREDNISONE 20 MG PO TABS: 40.0000 mg | ORAL_TABLET | Freq: Every day | ORAL | 0 refills | Status: AC

## 2021-06-19 NOTE — ED Provider Notes (Signed)
[facility]    CSN: [id number] Arrival date & time: 06/19/21  1149      History   Chief Complaint Chief Complaint  Patient presents with   Cough    HPI Louis Day is a 21 y.o. male.   Patient here today for evaluation of continued chest congestion and cough that has been going on for several weeks. He reports for the past 2 days he has had some discomfort substernally with deep breaths. He has not had fever. He notes cough is productive at times.   The history is provided by the patient.   History reviewed. No pertinent past medical history.  There are no problems to display for this patient.   History reviewed. No pertinent surgical history.     Home Medications    Prior to Admission medications   Medication Sig Start Date End Date Taking? Authorizing Provider  predniSONE (DELTASONE) 20 MG tablet Take 2 tablets (40 mg total) by mouth daily with breakfast for 5 days. 06/19/21 06/24/21 Yes Tomi Bamberger, PA-C  benzonatate (TESSALON) 100 MG capsule Take 1 capsule (100 mg total) by mouth 3 (three) times daily as needed for cough. 06/10/21   Lamptey, Britta Mccreedy, MD  diphenhydrAMINE (BENADRYL) 25 MG tablet Take 1 tablet (25 mg total) by mouth every 6 (six) hours as needed. 06/06/19 02/04/20  Dahlia Byes A, NP  fluticasone (FLONASE) 50 MCG/ACT nasal spray Place 1-2 sprays into both nostrils daily. 05/02/19 06/06/19  Wieters, Junius Creamer, PA-C    Family History Family History  Problem Relation Age of Onset   Healthy Mother    Healthy Father     Social History Social History   Tobacco Use   Smoking status: Never   Smokeless tobacco: Never  Substance Use Topics   Alcohol use: No   Drug use: No     Allergies   Patient has no known allergies.   Review of Systems Review of Systems  Constitutional:  Negative for chills and fever.  HENT:  Positive for congestion. Negative for ear pain and sore throat.   Eyes:  Negative for discharge and redness.   Respiratory:  Positive for cough. Negative for shortness of breath and wheezing.   Gastrointestinal:  Negative for abdominal pain, nausea and vomiting.    Physical Exam Triage Vital Signs ED Triage Vitals  Enc Vitals Group     BP      Pulse      Resp      Temp      Temp src      SpO2      Weight      Height      Head Circumference      Peak Flow      Pain Score      Pain Loc      Pain Edu?      Excl. in GC?    No data found.  Updated Vital Signs BP 113/60 (BP Location: Left Arm)    Pulse 85    Temp 98.4 F (36.9 C) (Oral)    Resp 14    SpO2 99%   Physical Exam Vitals and nursing note reviewed.  Constitutional:      General: He is not in acute distress.    Appearance: Normal appearance. He is not ill-appearing.  HENT:     Head: Normocephalic and atraumatic.     Nose: Nose normal. No congestion.     Mouth/Throat:     Mouth:  Mucous membranes are moist.     Pharynx: Oropharynx is clear. No oropharyngeal exudate or posterior oropharyngeal erythema.  Eyes:     Conjunctiva/sclera: Conjunctivae normal.  Cardiovascular:     Rate and Rhythm: Normal rate and regular rhythm.     Heart sounds: Normal heart sounds. No murmur heard. Pulmonary:     Effort: Pulmonary effort is normal. No respiratory distress.     Breath sounds: Normal breath sounds. No wheezing, rhonchi or rales.  Skin:    General: Skin is warm and dry.  Neurological:     Mental Status: He is alert.  Psychiatric:        Mood and Affect: Mood normal.        Thought Content: Thought content normal.     UC Treatments / Results  Labs (all labs ordered are listed, but only abnormal results are displayed) Labs Reviewed - No data to display  EKG   Radiology No results found.  Procedures Procedures (including critical care time)  Medications Ordered in UC Medications - No data to display  Initial Impression / Assessment and Plan / UC Course  I have reviewed the triage vital signs and the nursing  notes.  Pertinent labs & imaging results that were available during my care of the patient were reviewed by me and considered in my medical decision making (see chart for details).   Suspect symptoms most likely related to bronchitis. Vitals are stable, low suspicion for PE, pneumonia. Will treat with steroid burst. Encouraged follow up if symptoms fail to improve or worsen.   Final Clinical Impressions(s) / UC Diagnoses   Final diagnoses:  Bronchitis   Discharge Instructions   None    ED Prescriptions     Medication Sig Dispense Auth. Provider   predniSONE (DELTASONE) 20 MG tablet Take 2 tablets (40 mg total) by mouth daily with breakfast for 5 days. 10 tablet Tomi Bamberger, PA-C      PDMP not reviewed this encounter.   Tomi Bamberger, PA-C 06/19/21 1352

## 2021-06-19 NOTE — ED Triage Notes (Signed)
Pt reports that he has been sick with congestion and cough. Reports that feels like something in/on right side of throat. Pt reports that for past 2 days when takes a deep breath chest hurts.
